# Patient Record
Sex: Female | Born: 1970 | Race: Black or African American | Hispanic: No | Marital: Married | Smoking: Never smoker
Health system: Southern US, Community
[De-identification: ages and names within clinical notes are randomized; demographics above are authoritative.]

## PROBLEM LIST (undated history)

## (undated) DIAGNOSIS — I10 Essential (primary) hypertension: Secondary | ICD-10-CM

## (undated) HISTORY — DX: Essential (primary) hypertension: I10

---

## 2006-11-10 ENCOUNTER — Other Ambulatory Visit: Admission: RE | Admit: 2006-11-10 | Discharge: 2006-11-10 | Payer: Self-pay | Admitting: Family Medicine

## 2008-05-07 ENCOUNTER — Other Ambulatory Visit: Admission: RE | Admit: 2008-05-07 | Discharge: 2008-05-07 | Payer: Self-pay | Admitting: Family Medicine

## 2008-08-28 ENCOUNTER — Encounter (INDEPENDENT_AMBULATORY_CARE_PROVIDER_SITE_OTHER): Payer: Self-pay | Admitting: Obstetrics and Gynecology

## 2008-08-28 ENCOUNTER — Ambulatory Visit (HOSPITAL_COMMUNITY): Admission: RE | Admit: 2008-08-28 | Discharge: 2008-08-29 | Payer: Self-pay | Admitting: Obstetrics and Gynecology

## 2010-06-14 LAB — CBC
HCT: 35.1 % — ABNORMAL LOW (ref 36.0–46.0)
MCHC: 33.6 g/dL (ref 30.0–36.0)
MCV: 87.4 fL (ref 78.0–100.0)
Platelets: 256 10*3/uL (ref 150–400)
Platelets: 261 10*3/uL (ref 150–400)
RDW: 12.9 % (ref 11.5–15.5)
RDW: 13 % (ref 11.5–15.5)

## 2010-06-14 LAB — BASIC METABOLIC PANEL
CO2: 27 mEq/L (ref 19–32)
Calcium: 8.9 mg/dL (ref 8.4–10.5)
Creatinine, Ser: 0.86 mg/dL (ref 0.4–1.2)
GFR calc Af Amer: >60 mL/min (ref >60)
Glucose, Bld: 86 mg/dL (ref 70–99)

## 2010-06-14 LAB — HCG, SERUM, QUALITATIVE: Preg, Serum: NEGATIVE

## 2010-06-14 LAB — TYPE AND SCREEN: ABO/RH(D): AB POS

## 2010-07-20 NOTE — Op Note (Signed)
Theresa Fuentes, Theresa Fuentes              ACCOUNT NO.:  [id number]   MEDICAL RECORD NO.:  [id number]          PATIENT TYPE:  OIB   LOCATION:  9308                          FACILITY:  [facility]   PHYSICIAN:  Charles A. 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   PROCEDURE:  Transvaginal hysterectomy.   SURGEON:  Charles A. Sydnee Cabal, MD   ASSISTANT:  Gerald Leitz, MD   ANESTHESIA:  General by the endotracheal route.   OPERATIVE FINDINGS:  Normal tubes and ovaries.  Small uterus.   SPECIMEN:  Uterus to Pathology.   BLOOD LOSS:  250 mL.   Instrument, sponge and needle count correct x2.   DESCRIPTION OF PROCEDURE:  The patient was taken to the operating room,  placed supine position.  General anesthesia was induced without  difficulty.  She was then placed in dorsal lithotomy position and  sterile prep and drape was undertaken.  A weighted speculum was placed  in the vagina and the cervix was injected with 1% lidocaine with 1:200  epinephrine.  Circumferentially circumferential scoring incision was  then made.  Bladder pillars were cut anteriorly and sharp dissection was  used to develop the bladder plane.  Ray-Tec was intact in the area and  this did achieve anterior entry into the peritoneal cavity without  injury to the bladder.  Posterior colpotomy was made without difficulty  or injury to the bowel.  The uterosacral ligaments were taken in a  transfixion stitch with 0 Vicryl and held.  Uterine arteries were taken  on either side with 0 Vicryl transfixion stitches.  Hemostasis was  excellent.  Another cardinal ligament was taken up either side in  transfixion stitch with good  hemostasis.  Uterus was then inverted and  cornual regions were grasped, cut, free tied and then transfixion stitch  were placed with good hemostasis resulting.  There was moderate amount  of bleeding from the cuff and decision was made to go ahead and just get  it closed.  Richardson angle sutures were placed on the vaginal angles  with good hemostasis noted.  All  pedicles were of excellent hemostasis.  Richardson angle suture was  placed on the left and held.  Running locking 0 Vicryl was used to close  the cuff in running locking layer.  Hemostasis was excellent.  The  patient tolerated the procedure well and was taken to recovery with  physician in attendance.      Charles A. Sydnee Cabal, MD  Electronically Signed     CAD/MEDQ  D:  08/28/2008  T:  08/29/2008  Job:  [id number]

## 2014-06-30 ENCOUNTER — Other Ambulatory Visit: Payer: Self-pay

## 2014-06-30 DIAGNOSIS — Z1231 Encounter for screening mammogram for malignant neoplasm of breast: Secondary | ICD-10-CM

## 2014-07-17 ENCOUNTER — Other Ambulatory Visit: Payer: Self-pay

## 2014-07-17 ENCOUNTER — Ambulatory Visit
Admission: RE | Admit: 2014-07-17 | Discharge: 2014-07-17 | Disposition: A | Discharge status: Home / Self Care | Payer: Federal, State, Local not specified - PPO | Source: Ambulatory Visit

## 2014-07-17 DIAGNOSIS — Z1231 Encounter for screening mammogram for malignant neoplasm of breast: Secondary | ICD-10-CM

## 2016-06-07 ENCOUNTER — Ambulatory Visit: Payer: Self-pay

## 2016-06-07 ENCOUNTER — Ambulatory Visit (INDEPENDENT_AMBULATORY_CARE_PROVIDER_SITE_OTHER): Payer: Federal, State, Local not specified - PPO

## 2016-06-07 ENCOUNTER — Ambulatory Visit (INDEPENDENT_AMBULATORY_CARE_PROVIDER_SITE_OTHER): Payer: Federal, State, Local not specified - PPO | Admitting: Physician Assistant

## 2016-06-07 VITALS — BP 136/86 | HR 85 | Temp 97.9°F | Resp 16 | Wt 320.0 lb

## 2016-06-07 DIAGNOSIS — R0600 Dyspnea, unspecified: Secondary | ICD-10-CM | POA: Diagnosis not present

## 2016-06-07 DIAGNOSIS — H6983 Other specified disorders of Eustachian tube, bilateral: Secondary | ICD-10-CM

## 2016-06-07 MED ORDER — MECLIZINE HCL 25 MG PO TABS: 25.0000 mg | ORAL_TABLET | Freq: Three times a day (TID) | ORAL | 0 refills | Status: AC | PRN

## 2016-06-07 MED ORDER — FLUTICASONE PROPIONATE 50 MCG/ACT NA SUSP: 2.0000 | Freq: Every day | NASAL | 1 refills | Status: AC

## 2016-06-07 MED ORDER — PREDNISONE 20 MG PO TABS: 40.0000 mg | ORAL_TABLET | Freq: Every day | ORAL | 0 refills | Status: AC

## 2016-06-07 MED ORDER — CETIRIZINE HCL 10 MG PO TABS: 10.0000 mg | ORAL_TABLET | Freq: Every day | ORAL | 11 refills | Status: AC

## 2016-06-07 NOTE — Progress Notes (Signed)
PRIMARY CARE AT [facility] [address], [place] [address] [phone]  Date:  06/07/2016   Name:  Theresa Fuentes   DOB:  1970/08/29   MRN:  [id number]  PCP:  No PCP Per Patient    History of Present Illness:  Theresa Fuentes is a 46 y.o. female patient who presents to PCP with  Chief Complaint  Patient presents with  . Dizziness     X 1 DAY  . Sinus Problem     Patient reports dizziness for 1 day, and sinus problems.  Patient states that she feels frontal sinus pain and has watery eyes during the day.  Her head feels full.  No fever, sob, or dyspnea.  She expresses vertigo-like symptoms of the room spinning when she rises and is moving around.  She has noted no diaphoresis, or sob with these symptoms.  She reports no abnormal leg swelling or cough.  She feels the room spinning more when she is lying down on her back.  Comforted by laying on her side.  No sneezing.  Seasonal allergies.  Currently taking benadryl which helps her symptoms.   There are no active problems to display for this patient.   No past medical history on file.  No past surgical history on file.  Social History  Substance Use Topics  . Smoking status: Not on file  . Smokeless tobacco: Not on file  . Alcohol use Not on file    No family history on file.  No Known Allergies  Medication list has been reviewed and updated.  No current outpatient prescriptions on file prior to visit.   No current facility-administered medications on file prior to visit.     ROS ROS otherwise unremarkable unless listed above.  Physical Examination: BP 136/86   Pulse 85   Temp 97.9 F (36.6 C) (Oral)   Resp 16   Wt (!) 320 lb (145.2 kg)   SpO2 96%  Ideal Body Weight:    Physical Exam  Constitutional: She is oriented to person, place, and time. She appears well-developed and well-nourished. No distress.  HENT:  Head: Normocephalic and atraumatic.  Right Ear: External ear normal. A middle ear effusion is  present.  Left Ear: External ear normal. A middle ear effusion is present.  Eyes: Conjunctivae and EOM are normal. Pupils are equal, round, and reactive to light.  Cardiovascular: Normal rate.   Pulmonary/Chest: Effort normal. No respiratory distress.  Neurological: She is alert and oriented to person, place, and time.  Skin: She is not diaphoretic.  Psychiatric: She has a normal mood and affect. Her behavior is normal.   maneuvering (dix hall-pike) did not render any nystagmus, however she reports some mild room shifting with this exercise.    Dg Chest 2 View  Result Date: 06/07/2016 CLINICAL DATA:  Wheezing and lightheadedness, possible dyspnea, abnormal lung sounds on the right. EXAM: CHEST  2 VIEW COMPARISON:  None in PACs FINDINGS: The lungs are adequately inflated. There is no focal infiltrate. There is no pleural effusion. The heart and pulmonary vascularity are normal. The mediastinum is normal in width. The trachea is midline. The bony thorax exhibits no acute abnormality. IMPRESSION: No pneumonia, CHF, nor other acute cardiopulmonary abnormality. Electronically Signed   By: David  [place] M.D.   On: 06/07/2016 13:25    Orthostatic VS for the past 24 hrs (Last 3 readings):  BP- Lying Pulse- Lying BP- Sitting Pulse- Sitting BP- Standing at 0 minutes Pulse- Standing at 0  minutes  06/07/16 1311 130/85 91 128/85 91 121/83 97    Assessment and Plan: Theresa Fuentes is a 46 y.o. female who is here today for cc of dizziness and sinus issues.   This likely is bppv/eustachian tube dysfunction.  I will give her a short prednisone burst to help to relieve this congestion and restore equilibrium.  No cardiomegaly, I do not believe that her symptoms are cardiac related. Advised her to start a zyrtec as well rtc as needed Dysfunction of both eustachian tubes - Plan: cetirizine (ZYRTEC) 10 MG tablet, predniSONE (DELTASONE) 20 MG tablet  Dyspnea, unspecified type - Plan: DG Chest 2 View, CBC,  meclizine (ANTIVERT) 25 MG tablet, fluticasone (FLONASE) 50 MCG/ACT nasal spray  Theresa Platt, PA-C [facility] 4/4/201810:37 AM

## 2016-06-07 NOTE — Patient Instructions (Addendum)
Eustachian Tube Dysfunction The eustachian tube connects the middle ear to the back of the nose. It regulates air pressure in the middle ear by allowing air to move between the ear and nose. It also helps to drain fluid from the middle ear space. When the eustachian tube does not function properly, air pressure, fluid, or both can build up in the middle ear. Eustachian tube dysfunction can affect one or both ears. What are the causes? This condition happens when the eustachian tube becomes blocked or cannot open normally. This may result from:  Ear infections.  Colds and other upper respiratory infections.  Allergies.  Irritation, such as from cigarette smoke or acid from the stomach coming up into the esophagus (gastroesophageal reflux).  Sudden changes in air pressure, such as from descending in an airplane.  Abnormal growths in the nose or throat, such as nasal polyps, tumors, or enlarged tissue at the back of the throat (adenoids). What increases the risk? This condition may be more likely to develop in people who smoke and people who are overweight. Eustachian tube dysfunction may also be more likely to develop in children, especially children who have:  Certain birth defects of the mouth, such as cleft palate.  Large tonsils and adenoids. What are the signs or symptoms? Symptoms of this condition may include:  A feeling of fullness in the ear.  Ear pain.  Clicking or popping noises in the ear.  Ringing in the ear.  Hearing loss.  Loss of balance. Symptoms may get worse when the air pressure around you changes, such as when you travel to an area of high elevation or fly on an airplane. How is this diagnosed? This condition may be diagnosed based on:  Your symptoms.  A physical exam of your ear, nose, and throat.  Tests, such as those that measure:  The movement of your eardrum (tympanogram).  Your hearing (audiometry). How is this treated? Treatment depends on  the cause and severity of your condition. If your symptoms are mild, you may be able to relieve your symptoms by moving air into ("popping") your ears. If you have symptoms of fluid in your ears, treatment may include:  Decongestants.  Antihistamines.  Nasal sprays or ear drops that contain medicines that reduce swelling (steroids). In some cases, you may need to have a procedure to drain the fluid in your eardrum (myringotomy). In this procedure, a small tube is placed in the eardrum to:  Drain the fluid.  Restore the air in the middle ear space. Follow these instructions at home:  Take over-the-counter and prescription medicines only as told by your health care provider.  Use techniques to help pop your ears as recommended by your health care provider. These may include:  Chewing gum.  Yawning.  Frequent, forceful swallowing.  Closing your mouth, holding your nose closed, and gently blowing as if you are trying to blow air out of your nose.  Do not do any of the following until your health care provider approves:  Travel to high altitudes.  Fly in airplanes.  Work in a pressurized cabin or room.  Scuba dive.  Keep your ears dry. Dry your ears completely after showering or bathing.  Do not smoke.  Keep all follow-up visits as told by your health care provider. This is important. Contact a health care provider if:  Your symptoms do not go away after treatment.  Your symptoms come back after treatment.  You are unable to pop your ears.    You have:  A fever.  Pain in your ear.  Pain in your head or neck.  Fluid draining from your ear.  Your hearing suddenly changes.  You become very dizzy.  You lose your balance. This information is not intended to replace advice given to you by your health care provider. Make sure you discuss any questions you have with your health care provider. Document Released: 03/20/2015 Document Revised: 07/30/2015 Document  Reviewed: 03/12/2014 Elsevier Interactive Patient Education  2017 Elsevier Inc.     IF you received an x-ray today, you will receive an invoice from [facility]. Please contact [facility] at [phone] with questions or concerns regarding your invoice.   IF you received labwork today, you will receive an invoice from [facility]. Please contact [facility] at [phone] with questions or concerns regarding your invoice.   Our billing staff will not be able to assist you with questions regarding bills from these companies.  You will be contacted with the lab results as soon as they are available. The fastest way to get your results is to activate your My Chart account. Instructions are located on the last page of this paperwork. If you have not heard from us regarding the results in 2 weeks, please contact this office.      

## 2016-06-08 LAB — CBC
Hematocrit: 43.5 % (ref 34.0–46.6)
Hemoglobin: 14.5 g/dL (ref 11.1–15.9)
MCH: 28.3 pg (ref 26.6–33.0)
MCHC: 33.3 g/dL (ref 31.5–35.7)
MCV: 85 fL (ref 79–97)
PLATELETS: 335 10*3/uL (ref 150–379)
RBC: 5.12 x10E6/uL (ref 3.77–5.28)
RDW: 13.5 % (ref 12.3–15.4)
WBC: 8.6 10*3/uL (ref 3.4–10.8)

## 2016-09-19 ENCOUNTER — Other Ambulatory Visit: Payer: Self-pay | Admitting: Family Medicine

## 2016-09-19 DIAGNOSIS — Z1231 Encounter for screening mammogram for malignant neoplasm of breast: Secondary | ICD-10-CM

## 2016-10-07 ENCOUNTER — Ambulatory Visit
Admission: RE | Admit: 2016-10-07 | Discharge: 2016-10-07 | Disposition: A | Discharge status: Home / Self Care | Payer: Federal, State, Local not specified - PPO | Source: Ambulatory Visit | Attending: Family Medicine | Admitting: Family Medicine

## 2016-10-07 ENCOUNTER — Other Ambulatory Visit: Payer: Self-pay | Admitting: Family Medicine

## 2016-10-07 DIAGNOSIS — Z1231 Encounter for screening mammogram for malignant neoplasm of breast: Secondary | ICD-10-CM

## 2017-06-06 ENCOUNTER — Encounter: Payer: Self-pay | Admitting: Physician Assistant

## 2018-02-06 ENCOUNTER — Other Ambulatory Visit: Payer: Self-pay | Admitting: Family Medicine

## 2018-02-06 DIAGNOSIS — Z1231 Encounter for screening mammogram for malignant neoplasm of breast: Secondary | ICD-10-CM

## 2018-02-08 ENCOUNTER — Ambulatory Visit
Admission: RE | Admit: 2018-02-08 | Discharge: 2018-02-08 | Disposition: A | Discharge status: Home / Self Care | Payer: Federal, State, Local not specified - PPO | Source: Ambulatory Visit | Attending: Family Medicine | Admitting: Family Medicine

## 2018-02-08 DIAGNOSIS — Z1231 Encounter for screening mammogram for malignant neoplasm of breast: Secondary | ICD-10-CM

## 2018-02-27 DIAGNOSIS — Z131 Encounter for screening for diabetes mellitus: Secondary | ICD-10-CM | POA: Diagnosis not present

## 2018-02-27 DIAGNOSIS — Z0000 Encounter for general adult medical examination without abnormal findings: Secondary | ICD-10-CM | POA: Diagnosis not present

## 2018-02-27 DIAGNOSIS — I10 Essential (primary) hypertension: Secondary | ICD-10-CM | POA: Diagnosis not present

## 2018-02-27 DIAGNOSIS — Z6841 Body Mass Index (BMI) 40.0 and over, adult: Secondary | ICD-10-CM | POA: Diagnosis not present

## 2018-02-27 DIAGNOSIS — Z1331 Encounter for screening for depression: Secondary | ICD-10-CM | POA: Diagnosis not present

## 2018-03-08 DIAGNOSIS — I10 Essential (primary) hypertension: Secondary | ICD-10-CM | POA: Diagnosis not present

## 2019-01-02 ENCOUNTER — Other Ambulatory Visit: Payer: Self-pay | Admitting: Gynecology

## 2019-01-02 DIAGNOSIS — Z1231 Encounter for screening mammogram for malignant neoplasm of breast: Secondary | ICD-10-CM

## 2019-02-20 ENCOUNTER — Ambulatory Visit
Admission: RE | Admit: 2019-02-20 | Discharge: 2019-02-20 | Disposition: A | Discharge status: Home / Self Care | Payer: Federal, State, Local not specified - PPO | Source: Ambulatory Visit | Attending: Gynecology | Admitting: Gynecology

## 2019-02-20 ENCOUNTER — Other Ambulatory Visit: Payer: Self-pay

## 2019-02-20 DIAGNOSIS — Z1231 Encounter for screening mammogram for malignant neoplasm of breast: Secondary | ICD-10-CM

## 2019-05-30 ENCOUNTER — Ambulatory Visit: Payer: Federal, State, Local not specified - PPO | Attending: Internal Medicine

## 2019-05-30 DIAGNOSIS — Z23 Encounter for immunization: Secondary | ICD-10-CM

## 2019-05-30 NOTE — Progress Notes (Signed)
   Covid-19 Vaccination Clinic  Name:  Theresa Fuentes    MRN: [id number] DOB: February 26, 1971  05/30/2019  [name] was observed post Covid-19 immunization for 15 minutes without incident. She was provided with Vaccine Information Sheet and instruction to access the V-Safe system.   [name] was instructed to call 911 with any severe reactions post vaccine: Marland Kitchen Difficulty breathing  . Swelling of face and throat  . A fast heartbeat  . A bad rash all over body  . Dizziness and weakness   Immunizations Administered    Name Date Dose VIS Date Route   Pfizer COVID-19 Vaccine 05/30/2019 11:59 AM 0.3 mL 02/15/2019 Intramuscular   Manufacturer: [employer], [employer]   Lot: [id number]   NDC: [address]-6

## 2019-06-24 ENCOUNTER — Ambulatory Visit: Payer: Federal, State, Local not specified - PPO

## 2019-06-26 ENCOUNTER — Ambulatory Visit: Payer: Federal, State, Local not specified - PPO

## 2019-06-27 ENCOUNTER — Ambulatory Visit: Payer: Federal, State, Local not specified - PPO | Attending: Internal Medicine

## 2019-06-27 DIAGNOSIS — Z23 Encounter for immunization: Secondary | ICD-10-CM

## 2019-06-27 NOTE — Progress Notes (Signed)
   Covid-19 Vaccination Clinic  Name:  Theresa Fuentes    MRN: [id number] DOB: 1971-02-16  06/27/2019  Theresa Fuentes was observed post Covid-19 immunization for 15 minutes without incident. She was provided with Vaccine Information Sheet and instruction to access the V-Safe system.   Theresa Fuentes was instructed to call 911 with any severe reactions post vaccine: Marland Kitchen Difficulty breathing  . Swelling of face and throat  . A fast heartbeat  . A bad rash all over body  . Dizziness and weakness   Immunizations Administered    Name Date Dose VIS Date Route   Pfizer COVID-19 Vaccine 06/27/2019 12:46 PM 0.3 mL 05/01/2018 Intramuscular   Manufacturer: [employer], [employer]   Lot: [id number]   NDC: [address]-8

## 2019-08-19 DIAGNOSIS — M25562 Pain in left knee: Secondary | ICD-10-CM | POA: Diagnosis not present

## 2019-08-19 DIAGNOSIS — R7303 Prediabetes: Secondary | ICD-10-CM | POA: Diagnosis not present

## 2019-08-19 DIAGNOSIS — R82998 Other abnormal findings in urine: Secondary | ICD-10-CM | POA: Diagnosis not present

## 2019-08-19 DIAGNOSIS — R030 Elevated blood-pressure reading, without diagnosis of hypertension: Secondary | ICD-10-CM | POA: Diagnosis not present

## 2019-08-23 ENCOUNTER — Other Ambulatory Visit: Payer: Self-pay | Admitting: Nurse Practitioner

## 2019-08-23 ENCOUNTER — Other Ambulatory Visit: Payer: Self-pay

## 2019-08-23 ENCOUNTER — Ambulatory Visit
Admission: RE | Admit: 2019-08-23 | Discharge: 2019-08-23 | Disposition: A | Discharge status: Home / Self Care | Payer: Federal, State, Local not specified - PPO | Source: Ambulatory Visit | Attending: Nurse Practitioner | Admitting: Nurse Practitioner

## 2019-08-23 DIAGNOSIS — M25562 Pain in left knee: Secondary | ICD-10-CM

## 2019-08-23 DIAGNOSIS — M1712 Unilateral primary osteoarthritis, left knee: Secondary | ICD-10-CM | POA: Diagnosis not present

## 2019-08-23 DIAGNOSIS — M7989 Other specified soft tissue disorders: Secondary | ICD-10-CM | POA: Diagnosis not present

## 2019-08-28 ENCOUNTER — Other Ambulatory Visit: Payer: Self-pay | Admitting: Nurse Practitioner

## 2019-08-28 ENCOUNTER — Other Ambulatory Visit (HOSPITAL_COMMUNITY)
Admission: RE | Admit: 2019-08-28 | Discharge: 2019-08-28 | Disposition: A | Discharge status: Home / Self Care | Payer: Federal, State, Local not specified - PPO | Source: Ambulatory Visit | Attending: Nurse Practitioner | Admitting: Nurse Practitioner

## 2019-08-28 DIAGNOSIS — Z1151 Encounter for screening for human papillomavirus (HPV): Secondary | ICD-10-CM | POA: Diagnosis not present

## 2019-08-28 DIAGNOSIS — Z124 Encounter for screening for malignant neoplasm of cervix: Secondary | ICD-10-CM | POA: Insufficient documentation

## 2019-08-31 DIAGNOSIS — Z9884 Bariatric surgery status: Secondary | ICD-10-CM | POA: Diagnosis not present

## 2019-08-31 DIAGNOSIS — I10 Essential (primary) hypertension: Secondary | ICD-10-CM | POA: Diagnosis not present

## 2019-08-31 DIAGNOSIS — R7303 Prediabetes: Secondary | ICD-10-CM | POA: Diagnosis not present

## 2019-09-02 LAB — CYTOLOGY - PAP
Chlamydia: NEGATIVE
Comment: NEGATIVE
Comment: NEGATIVE
Comment: NEGATIVE
Comment: NEGATIVE
Comment: NORMAL
Diagnosis: NEGATIVE
HSV1: NEGATIVE
HSV2: NEGATIVE
High risk HPV: NEGATIVE
Neisseria Gonorrhea: NEGATIVE
Trichomonas: NEGATIVE

## 2019-10-02 ENCOUNTER — Other Ambulatory Visit: Payer: Self-pay | Admitting: Nurse Practitioner

## 2019-10-02 DIAGNOSIS — Z1231 Encounter for screening mammogram for malignant neoplasm of breast: Secondary | ICD-10-CM

## 2020-01-20 DIAGNOSIS — Z23 Encounter for immunization: Secondary | ICD-10-CM | POA: Diagnosis not present

## 2020-01-20 DIAGNOSIS — I10 Essential (primary) hypertension: Secondary | ICD-10-CM | POA: Diagnosis not present

## 2020-01-20 DIAGNOSIS — Z6841 Body Mass Index (BMI) 40.0 and over, adult: Secondary | ICD-10-CM | POA: Diagnosis not present

## 2020-01-20 DIAGNOSIS — R7303 Prediabetes: Secondary | ICD-10-CM | POA: Diagnosis not present

## 2020-01-21 DIAGNOSIS — I10 Essential (primary) hypertension: Secondary | ICD-10-CM | POA: Diagnosis not present

## 2020-01-21 DIAGNOSIS — R7303 Prediabetes: Secondary | ICD-10-CM | POA: Diagnosis not present

## 2020-02-24 ENCOUNTER — Ambulatory Visit
Admission: RE | Admit: 2020-02-24 | Discharge: 2020-02-24 | Disposition: A | Discharge status: Home / Self Care | Payer: Federal, State, Local not specified - PPO | Source: Ambulatory Visit | Attending: Nurse Practitioner | Admitting: Nurse Practitioner

## 2020-02-24 ENCOUNTER — Other Ambulatory Visit: Payer: Self-pay

## 2020-02-24 DIAGNOSIS — Z1231 Encounter for screening mammogram for malignant neoplasm of breast: Secondary | ICD-10-CM

## 2020-03-16 DIAGNOSIS — Z1152 Encounter for screening for COVID-19: Secondary | ICD-10-CM | POA: Diagnosis not present

## 2020-04-02 ENCOUNTER — Other Ambulatory Visit: Payer: Self-pay

## 2020-04-02 ENCOUNTER — Ambulatory Visit
Admission: RE | Admit: 2020-04-02 | Discharge: 2020-04-02 | Disposition: A | Discharge status: Home / Self Care | Payer: Federal, State, Local not specified - PPO | Source: Ambulatory Visit | Attending: Nurse Practitioner | Admitting: Nurse Practitioner

## 2020-04-02 DIAGNOSIS — Z1231 Encounter for screening mammogram for malignant neoplasm of breast: Secondary | ICD-10-CM | POA: Diagnosis not present

## 2020-05-19 DIAGNOSIS — I10 Essential (primary) hypertension: Secondary | ICD-10-CM | POA: Diagnosis not present

## 2020-05-19 DIAGNOSIS — Z9884 Bariatric surgery status: Secondary | ICD-10-CM | POA: Diagnosis not present

## 2020-05-19 DIAGNOSIS — R7303 Prediabetes: Secondary | ICD-10-CM | POA: Diagnosis not present

## 2020-05-19 DIAGNOSIS — Z7189 Other specified counseling: Secondary | ICD-10-CM | POA: Diagnosis not present

## 2020-08-17 ENCOUNTER — Emergency Department (HOSPITAL_COMMUNITY)
Admission: EM | Admit: 2020-08-17 | Discharge: 2020-08-18 | Disposition: A | Discharge status: Home / Self Care | Payer: Federal, State, Local not specified - PPO | Attending: Emergency Medicine | Admitting: Emergency Medicine

## 2020-08-17 ENCOUNTER — Encounter (HOSPITAL_COMMUNITY): Payer: Self-pay

## 2020-08-17 ENCOUNTER — Emergency Department (HOSPITAL_COMMUNITY): Payer: Federal, State, Local not specified - PPO

## 2020-08-17 ENCOUNTER — Other Ambulatory Visit: Payer: Self-pay

## 2020-08-17 DIAGNOSIS — S82142A Displaced bicondylar fracture of left tibia, initial encounter for closed fracture: Secondary | ICD-10-CM | POA: Diagnosis not present

## 2020-08-17 DIAGNOSIS — Z23 Encounter for immunization: Secondary | ICD-10-CM | POA: Insufficient documentation

## 2020-08-17 DIAGNOSIS — M1712 Unilateral primary osteoarthritis, left knee: Secondary | ICD-10-CM | POA: Diagnosis not present

## 2020-08-17 DIAGNOSIS — S82122A Displaced fracture of lateral condyle of left tibia, initial encounter for closed fracture: Secondary | ICD-10-CM | POA: Insufficient documentation

## 2020-08-17 DIAGNOSIS — S90811A Abrasion, right foot, initial encounter: Secondary | ICD-10-CM | POA: Insufficient documentation

## 2020-08-17 DIAGNOSIS — Z043 Encounter for examination and observation following other accident: Secondary | ICD-10-CM | POA: Diagnosis not present

## 2020-08-17 DIAGNOSIS — Y92410 Unspecified street and highway as the place of occurrence of the external cause: Secondary | ICD-10-CM | POA: Diagnosis not present

## 2020-08-17 DIAGNOSIS — M25462 Effusion, left knee: Secondary | ICD-10-CM | POA: Diagnosis not present

## 2020-08-17 DIAGNOSIS — S8992XA Unspecified injury of left lower leg, initial encounter: Secondary | ICD-10-CM | POA: Diagnosis not present

## 2020-08-17 HISTORY — DX: Essential (primary) hypertension: I10

## 2020-08-17 MED ORDER — TETANUS-DIPHTH-ACELL PERTUSSIS 5-2.5-18.5 LF-MCG/0.5 IM SUSY
0.5000 mL | PREFILLED_SYRINGE | Freq: Once | INTRAMUSCULAR | Status: AC
  Administered 2020-08-17: 20:00:00 0.5 mL via INTRAMUSCULAR
  Filled 2020-08-17: qty 0.5
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
## 2020-08-18 ENCOUNTER — Emergency Department (HOSPITAL_COMMUNITY): Payer: Federal, State, Local not specified - PPO

## 2020-08-18 DIAGNOSIS — M1712 Unilateral primary osteoarthritis, left knee: Secondary | ICD-10-CM | POA: Diagnosis not present

## 2020-08-18 DIAGNOSIS — S82125A Nondisplaced fracture of lateral condyle of left tibia, initial encounter for closed fracture: Secondary | ICD-10-CM | POA: Diagnosis not present

## 2020-08-18 DIAGNOSIS — S82142A Displaced bicondylar fracture of left tibia, initial encounter for closed fracture: Secondary | ICD-10-CM | POA: Diagnosis not present

## 2020-08-18 DIAGNOSIS — M25462 Effusion, left knee: Secondary | ICD-10-CM | POA: Diagnosis not present

## 2020-08-18 MED ORDER — METHOCARBAMOL 500 MG PO TABS: 500.0000 mg | ORAL_TABLET | Freq: Two times a day (BID) | ORAL | 0 refills | Status: AC

## 2020-08-18 MED ORDER — OXYCODONE HCL 5 MG PO TABS: 5.0000 mg | ORAL_TABLET | Freq: Four times a day (QID) | ORAL | 0 refills | Status: AC | PRN

## 2020-08-18 MED ORDER — OXYCODONE-ACETAMINOPHEN 5-325 MG PO TABS
2.0000 | ORAL_TABLET | Freq: Once | ORAL | Status: AC
  Administered 2020-08-18: 01:00:00 2 via ORAL
  Filled 2020-08-18: qty 2

## 2020-08-18 NOTE — Progress Notes (Signed)
Orthopedic Tech Progress Note Patient Details:  Theresa Fuentes [birth year] [id number]  Ortho Devices Type of Ortho Device: Knee Immobilizer Ortho Device/Splint Location: lue Ortho Device/Splint Interventions: Ordered, Application, Adjustment   Post Interventions Patient Tolerated: Well Instructions Provided: Care of device, Adjustment of device  Trinna Post 08/18/2020, 1:49 AM

## 2020-08-18 NOTE — ED Notes (Signed)
Ortho tech consulted to apply knee immobilizer and provide crutches.
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
## 2020-08-18 NOTE — Discharge Instructions (Addendum)
1. Medications: alternate naprosyn and tylenol for pain control, use Robaxin for muscle spasms and Roxicodone for severe pain usual home medications 2. Treatment: rest, ice, elevate and use brace, drink plenty of fluids, gentle stretching -nonweightbearing 3. Follow Up: Please followup with orthopedics as directed. Please return to the ER for worsening symptoms or other concerns

## 2020-08-25 DIAGNOSIS — S82102S Unspecified fracture of upper end of left tibia, sequela: Secondary | ICD-10-CM | POA: Diagnosis not present

## 2020-08-27 DIAGNOSIS — S82102S Unspecified fracture of upper end of left tibia, sequela: Secondary | ICD-10-CM | POA: Diagnosis not present

## 2020-09-09 DIAGNOSIS — S82102S Unspecified fracture of upper end of left tibia, sequela: Secondary | ICD-10-CM | POA: Diagnosis not present

## 2020-09-11 DIAGNOSIS — S82102S Unspecified fracture of upper end of left tibia, sequela: Secondary | ICD-10-CM | POA: Diagnosis not present

## 2020-09-16 DIAGNOSIS — S82102S Unspecified fracture of upper end of left tibia, sequela: Secondary | ICD-10-CM | POA: Diagnosis not present

## 2020-09-22 DIAGNOSIS — S82102S Unspecified fracture of upper end of left tibia, sequela: Secondary | ICD-10-CM | POA: Diagnosis not present

## 2020-10-01 DIAGNOSIS — S82102A Unspecified fracture of upper end of left tibia, initial encounter for closed fracture: Secondary | ICD-10-CM | POA: Diagnosis not present

## 2020-11-03 DIAGNOSIS — R7303 Prediabetes: Secondary | ICD-10-CM | POA: Diagnosis not present

## 2020-11-03 DIAGNOSIS — I10 Essential (primary) hypertension: Secondary | ICD-10-CM | POA: Diagnosis not present

## 2020-11-03 DIAGNOSIS — L83 Acanthosis nigricans: Secondary | ICD-10-CM | POA: Diagnosis not present

## 2020-12-09 DIAGNOSIS — I10 Essential (primary) hypertension: Secondary | ICD-10-CM | POA: Diagnosis not present

## 2020-12-09 DIAGNOSIS — R7303 Prediabetes: Secondary | ICD-10-CM | POA: Diagnosis not present

## 2020-12-09 DIAGNOSIS — U071 COVID-19: Secondary | ICD-10-CM | POA: Diagnosis not present

## 2021-05-03 ENCOUNTER — Other Ambulatory Visit: Payer: Self-pay | Admitting: Family Medicine

## 2021-05-03 DIAGNOSIS — Z1231 Encounter for screening mammogram for malignant neoplasm of breast: Secondary | ICD-10-CM

## 2021-05-11 ENCOUNTER — Inpatient Hospital Stay: Admission: RE | Admit: 2021-05-11 | Payer: Federal, State, Local not specified - PPO | Source: Ambulatory Visit

## 2021-05-11 ENCOUNTER — Ambulatory Visit
Admission: RE | Admit: 2021-05-11 | Discharge: 2021-05-11 | Disposition: A | Discharge status: Home / Self Care | Payer: Federal, State, Local not specified - PPO | Source: Ambulatory Visit | Attending: Family Medicine | Admitting: Family Medicine

## 2021-05-11 DIAGNOSIS — Z1231 Encounter for screening mammogram for malignant neoplasm of breast: Secondary | ICD-10-CM | POA: Diagnosis not present

## 2021-05-12 DIAGNOSIS — R7303 Prediabetes: Secondary | ICD-10-CM | POA: Diagnosis not present

## 2021-05-12 DIAGNOSIS — I10 Essential (primary) hypertension: Secondary | ICD-10-CM | POA: Diagnosis not present

## 2021-06-13 ENCOUNTER — Encounter (INDEPENDENT_AMBULATORY_CARE_PROVIDER_SITE_OTHER): Payer: Federal, State, Local not specified - PPO | Admitting: General Surgery

## 2021-06-13 DIAGNOSIS — Z09 Encounter for follow-up examination after completed treatment for conditions other than malignant neoplasm: Secondary | ICD-10-CM
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
## 2021-07-07 DIAGNOSIS — R7303 Prediabetes: Secondary | ICD-10-CM | POA: Diagnosis not present

## 2021-07-07 DIAGNOSIS — I10 Essential (primary) hypertension: Secondary | ICD-10-CM | POA: Diagnosis not present

## 2021-10-25 LAB — COLOGUARD: COLOGUARD: NEGATIVE

## 2021-10-25 LAB — EXTERNAL GENERIC LAB PROCEDURE: COLOGUARD: NEGATIVE

## 2021-11-30 DIAGNOSIS — E8881 Metabolic syndrome: Secondary | ICD-10-CM | POA: Diagnosis not present

## 2021-11-30 DIAGNOSIS — R7303 Prediabetes: Secondary | ICD-10-CM | POA: Diagnosis not present

## 2021-11-30 DIAGNOSIS — I10 Essential (primary) hypertension: Secondary | ICD-10-CM | POA: Diagnosis not present

## 2022-01-03 ENCOUNTER — Other Ambulatory Visit (HOSPITAL_COMMUNITY): Payer: Self-pay

## 2022-01-03 MED ORDER — OZEMPIC (2 MG/DOSE) 8 MG/3ML ~~LOC~~ SOPN
2.5000 mg | PEN_INJECTOR | SUBCUTANEOUS | 3 refills | Status: DC
  Filled 2022-01-03: qty 12, 84d supply, fill #0

## 2022-01-03 MED ORDER — OZEMPIC (2 MG/DOSE) 8 MG/3ML ~~LOC~~ SOPN
2.0000 mg | PEN_INJECTOR | SUBCUTANEOUS | 3 refills | Status: DC
  Filled 2022-01-03: qty 3, 28d supply, fill #0
  Filled 2022-01-17: qty 3, 30d supply, fill #0
  Filled 2022-03-01: qty 3, 30d supply, fill #1

## 2022-01-11 ENCOUNTER — Other Ambulatory Visit (HOSPITAL_COMMUNITY): Payer: Self-pay

## 2022-01-17 ENCOUNTER — Other Ambulatory Visit (HOSPITAL_COMMUNITY): Payer: Self-pay

## 2022-01-19 ENCOUNTER — Other Ambulatory Visit (HOSPITAL_COMMUNITY): Payer: Self-pay

## 2022-03-03 ENCOUNTER — Other Ambulatory Visit (HOSPITAL_COMMUNITY): Payer: Self-pay

## 2022-03-03 ENCOUNTER — Other Ambulatory Visit: Payer: Self-pay

## 2022-03-03 DIAGNOSIS — Z9884 Bariatric surgery status: Secondary | ICD-10-CM | POA: Diagnosis not present

## 2022-03-03 DIAGNOSIS — R7303 Prediabetes: Secondary | ICD-10-CM | POA: Diagnosis not present

## 2022-03-03 DIAGNOSIS — Z1389 Encounter for screening for other disorder: Secondary | ICD-10-CM | POA: Diagnosis not present

## 2022-03-03 DIAGNOSIS — E559 Vitamin D deficiency, unspecified: Secondary | ICD-10-CM | POA: Diagnosis not present

## 2022-03-03 DIAGNOSIS — I10 Essential (primary) hypertension: Secondary | ICD-10-CM | POA: Diagnosis not present

## 2022-03-03 DIAGNOSIS — K912 Postsurgical malabsorption, not elsewhere classified: Secondary | ICD-10-CM | POA: Diagnosis not present

## 2022-03-03 DIAGNOSIS — Z9189 Other specified personal risk factors, not elsewhere classified: Secondary | ICD-10-CM | POA: Diagnosis not present

## 2022-03-03 DIAGNOSIS — E8889 Other specified metabolic disorders: Secondary | ICD-10-CM | POA: Diagnosis not present

## 2022-03-03 MED ORDER — ZEPBOUND 7.5 MG/0.5ML ~~LOC~~ SOAJ
7.5000 mg | SUBCUTANEOUS | 1 refills | Status: DC
  Filled 2022-03-03: qty 2, 28d supply, fill #0

## 2022-03-03 MED ORDER — WEGOVY 2.4 MG/0.75ML ~~LOC~~ SOAJ
2.4000 mg | SUBCUTANEOUS | 1 refills | Status: AC
  Filled 2022-03-03 – 2022-03-04 (×3): qty 3, 28d supply, fill #0
  Filled 2022-03-27: qty 3, 28d supply, fill #1

## 2022-03-04 ENCOUNTER — Other Ambulatory Visit: Payer: Self-pay

## 2022-03-04 ENCOUNTER — Other Ambulatory Visit (HOSPITAL_COMMUNITY): Payer: Self-pay

## 2022-03-09 ENCOUNTER — Other Ambulatory Visit: Payer: Self-pay

## 2022-03-09 ENCOUNTER — Other Ambulatory Visit (HOSPITAL_COMMUNITY): Payer: Self-pay

## 2022-03-09 MED ORDER — VITAMIN D (ERGOCALCIFEROL) 1.25 MG (50000 UNIT) PO CAPS
50000.0000 [IU] | ORAL_CAPSULE | ORAL | 2 refills | Status: DC
  Filled 2022-03-09: qty 4, 28d supply, fill #0
  Filled 2022-03-27: qty 4, 28d supply, fill #1
  Filled 2022-04-01: qty 4, 28d supply, fill #0
  Filled 2022-04-22 (×2): qty 4, 28d supply, fill #1

## 2022-03-11 ENCOUNTER — Other Ambulatory Visit (HOSPITAL_COMMUNITY): Payer: Self-pay

## 2022-03-11 ENCOUNTER — Other Ambulatory Visit: Payer: Self-pay

## 2022-03-17 DIAGNOSIS — E539 Vitamin B deficiency, unspecified: Secondary | ICD-10-CM | POA: Diagnosis not present

## 2022-03-17 DIAGNOSIS — G4733 Obstructive sleep apnea (adult) (pediatric): Secondary | ICD-10-CM | POA: Diagnosis not present

## 2022-03-17 DIAGNOSIS — R7303 Prediabetes: Secondary | ICD-10-CM | POA: Diagnosis not present

## 2022-03-17 DIAGNOSIS — I10 Essential (primary) hypertension: Secondary | ICD-10-CM | POA: Diagnosis not present

## 2022-03-28 ENCOUNTER — Other Ambulatory Visit (HOSPITAL_COMMUNITY): Payer: Self-pay

## 2022-03-31 ENCOUNTER — Other Ambulatory Visit (HOSPITAL_COMMUNITY): Payer: Self-pay

## 2022-03-31 DIAGNOSIS — E539 Vitamin B deficiency, unspecified: Secondary | ICD-10-CM | POA: Diagnosis not present

## 2022-03-31 DIAGNOSIS — R7303 Prediabetes: Secondary | ICD-10-CM | POA: Diagnosis not present

## 2022-03-31 DIAGNOSIS — G4733 Obstructive sleep apnea (adult) (pediatric): Secondary | ICD-10-CM | POA: Diagnosis not present

## 2022-03-31 DIAGNOSIS — I10 Essential (primary) hypertension: Secondary | ICD-10-CM | POA: Diagnosis not present

## 2022-03-31 MED ORDER — WEGOVY 2.4 MG/0.75ML ~~LOC~~ SOAJ
2.4000 mg | SUBCUTANEOUS | 1 refills | Status: DC
  Filled 2022-03-31 – 2022-04-22 (×2): qty 3, 28d supply, fill #0
  Filled 2022-05-26: qty 3, 28d supply, fill #1

## 2022-04-01 ENCOUNTER — Other Ambulatory Visit (HOSPITAL_COMMUNITY): Payer: Self-pay

## 2022-04-01 ENCOUNTER — Other Ambulatory Visit: Payer: Self-pay

## 2022-04-22 ENCOUNTER — Other Ambulatory Visit (HOSPITAL_COMMUNITY): Payer: Self-pay

## 2022-04-26 ENCOUNTER — Other Ambulatory Visit (HOSPITAL_COMMUNITY): Payer: Self-pay

## 2022-04-26 DIAGNOSIS — R7303 Prediabetes: Secondary | ICD-10-CM | POA: Diagnosis not present

## 2022-04-26 DIAGNOSIS — I10 Essential (primary) hypertension: Secondary | ICD-10-CM | POA: Diagnosis not present

## 2022-04-26 DIAGNOSIS — E539 Vitamin B deficiency, unspecified: Secondary | ICD-10-CM | POA: Diagnosis not present

## 2022-04-26 DIAGNOSIS — K912 Postsurgical malabsorption, not elsewhere classified: Secondary | ICD-10-CM | POA: Diagnosis not present

## 2022-04-26 MED ORDER — WEGOVY 2.4 MG/0.75ML ~~LOC~~ SOAJ
2.4000 mg | SUBCUTANEOUS | 1 refills | Status: DC
  Filled 2022-04-26 – 2022-06-23 (×2): qty 3, 28d supply, fill #0
  Filled 2022-07-18 – 2022-07-26 (×2): qty 3, 28d supply, fill #1

## 2022-04-26 MED ORDER — VITAMIN D (ERGOCALCIFEROL) 1.25 MG (50000 UNIT) PO CAPS
50000.0000 [IU] | ORAL_CAPSULE | ORAL | 2 refills | Status: DC
  Filled 2022-04-26 – 2022-05-26 (×2): qty 4, 28d supply, fill #0
  Filled 2022-06-19: qty 4, 28d supply, fill #1
  Filled 2022-07-18 – 2022-11-07 (×3): qty 4, 28d supply, fill #2

## 2022-05-06 DIAGNOSIS — Z13220 Encounter for screening for lipoid disorders: Secondary | ICD-10-CM | POA: Diagnosis not present

## 2022-05-06 DIAGNOSIS — I10 Essential (primary) hypertension: Secondary | ICD-10-CM | POA: Diagnosis not present

## 2022-05-26 ENCOUNTER — Other Ambulatory Visit (HOSPITAL_COMMUNITY): Payer: Self-pay

## 2022-06-16 ENCOUNTER — Other Ambulatory Visit (HOSPITAL_COMMUNITY): Payer: Self-pay

## 2022-06-23 ENCOUNTER — Other Ambulatory Visit (HOSPITAL_COMMUNITY): Payer: Self-pay

## 2022-06-23 ENCOUNTER — Other Ambulatory Visit: Payer: Self-pay

## 2022-06-24 ENCOUNTER — Other Ambulatory Visit: Payer: Self-pay

## 2022-07-04 ENCOUNTER — Other Ambulatory Visit (HOSPITAL_COMMUNITY): Payer: Self-pay

## 2022-07-04 DIAGNOSIS — R7303 Prediabetes: Secondary | ICD-10-CM | POA: Diagnosis not present

## 2022-07-04 DIAGNOSIS — G4733 Obstructive sleep apnea (adult) (pediatric): Secondary | ICD-10-CM | POA: Diagnosis not present

## 2022-07-04 DIAGNOSIS — E539 Vitamin B deficiency, unspecified: Secondary | ICD-10-CM | POA: Diagnosis not present

## 2022-07-04 DIAGNOSIS — I10 Essential (primary) hypertension: Secondary | ICD-10-CM | POA: Diagnosis not present

## 2022-07-04 MED ORDER — VITAMIN D (ERGOCALCIFEROL) 1.25 MG (50000 UNIT) PO CAPS
50000.0000 [IU] | ORAL_CAPSULE | ORAL | 2 refills | Status: AC
  Filled 2022-07-04 – 2022-07-26 (×3): qty 4, 28d supply, fill #0
  Filled 2022-09-23: qty 4, 28d supply, fill #1
  Filled 2022-12-07: qty 4, 28d supply, fill #2

## 2022-07-04 MED ORDER — ZEPBOUND 10 MG/0.5ML ~~LOC~~ SOAJ
10.0000 mg | SUBCUTANEOUS | 1 refills | Status: AC
  Filled 2022-07-04 – 2022-08-19 (×2): qty 2, 28d supply, fill #0

## 2022-07-05 ENCOUNTER — Other Ambulatory Visit (HOSPITAL_COMMUNITY): Payer: Self-pay

## 2022-07-07 ENCOUNTER — Other Ambulatory Visit (HOSPITAL_COMMUNITY): Payer: Self-pay

## 2022-07-12 ENCOUNTER — Other Ambulatory Visit (HOSPITAL_COMMUNITY): Payer: Self-pay

## 2022-07-18 ENCOUNTER — Other Ambulatory Visit: Payer: Self-pay

## 2022-07-18 ENCOUNTER — Other Ambulatory Visit (HOSPITAL_COMMUNITY): Payer: Self-pay

## 2022-07-22 ENCOUNTER — Other Ambulatory Visit (HOSPITAL_COMMUNITY): Payer: Self-pay

## 2022-07-25 ENCOUNTER — Other Ambulatory Visit (HOSPITAL_COMMUNITY): Payer: Self-pay

## 2022-07-25 MED ORDER — LOMAIRA 8 MG PO TABS
8.0000 mg | ORAL_TABLET | Freq: Every morning | ORAL | 0 refills | Status: DC
  Filled 2022-07-25: qty 30, 30d supply, fill #0

## 2022-07-26 ENCOUNTER — Other Ambulatory Visit (HOSPITAL_COMMUNITY): Payer: Self-pay

## 2022-08-03 DIAGNOSIS — Z1211 Encounter for screening for malignant neoplasm of colon: Secondary | ICD-10-CM | POA: Diagnosis not present

## 2022-08-04 ENCOUNTER — Other Ambulatory Visit (HOSPITAL_COMMUNITY): Payer: Self-pay

## 2022-08-04 DIAGNOSIS — R7303 Prediabetes: Secondary | ICD-10-CM | POA: Diagnosis not present

## 2022-08-04 DIAGNOSIS — Z6841 Body Mass Index (BMI) 40.0 and over, adult: Secondary | ICD-10-CM | POA: Diagnosis not present

## 2022-08-04 DIAGNOSIS — E539 Vitamin B deficiency, unspecified: Secondary | ICD-10-CM | POA: Diagnosis not present

## 2022-08-04 DIAGNOSIS — G4733 Obstructive sleep apnea (adult) (pediatric): Secondary | ICD-10-CM | POA: Diagnosis not present

## 2022-08-04 DIAGNOSIS — I10 Essential (primary) hypertension: Secondary | ICD-10-CM | POA: Diagnosis not present

## 2022-08-04 MED ORDER — WEGOVY 2.4 MG/0.75ML ~~LOC~~ SOAJ
2.4000 mg | SUBCUTANEOUS | 1 refills | Status: AC
  Filled 2022-08-04 – 2022-11-11 (×3): qty 3, 28d supply, fill #0
  Filled 2022-12-07: qty 3, 28d supply, fill #1

## 2022-08-04 MED ORDER — LOMAIRA 8 MG PO TABS
8.0000 mg | ORAL_TABLET | Freq: Every morning | ORAL | 0 refills | Status: DC
  Filled 2022-08-04 – 2022-08-19 (×2): qty 30, 30d supply, fill #0

## 2022-08-18 ENCOUNTER — Other Ambulatory Visit (HOSPITAL_COMMUNITY): Payer: Self-pay

## 2022-08-18 ENCOUNTER — Other Ambulatory Visit: Payer: Self-pay

## 2022-08-19 ENCOUNTER — Other Ambulatory Visit: Payer: Self-pay

## 2022-08-19 ENCOUNTER — Other Ambulatory Visit (HOSPITAL_COMMUNITY): Payer: Self-pay

## 2022-08-22 ENCOUNTER — Other Ambulatory Visit (HOSPITAL_COMMUNITY): Payer: Self-pay

## 2022-08-22 MED ORDER — WEGOVY 2.4 MG/0.75ML ~~LOC~~ SOAJ
2.4000 mg | SUBCUTANEOUS | 1 refills | Status: AC
  Filled 2022-08-22: qty 3, 28d supply, fill #0
  Filled 2022-09-26: qty 3, 28d supply, fill #1

## 2022-08-24 ENCOUNTER — Other Ambulatory Visit (HOSPITAL_COMMUNITY): Payer: Self-pay

## 2022-08-25 ENCOUNTER — Other Ambulatory Visit: Payer: Self-pay

## 2022-09-06 ENCOUNTER — Other Ambulatory Visit (HOSPITAL_COMMUNITY): Payer: Self-pay

## 2022-09-16 ENCOUNTER — Other Ambulatory Visit (HOSPITAL_COMMUNITY): Payer: Self-pay

## 2022-09-20 ENCOUNTER — Other Ambulatory Visit (HOSPITAL_COMMUNITY): Payer: Self-pay

## 2022-09-20 MED ORDER — LOMAIRA 8 MG PO TABS
8.0000 mg | ORAL_TABLET | ORAL | 0 refills | Status: DC
  Filled 2022-09-20: qty 30, 30d supply, fill #0

## 2022-09-23 ENCOUNTER — Other Ambulatory Visit: Payer: Self-pay

## 2022-09-23 ENCOUNTER — Other Ambulatory Visit (HOSPITAL_COMMUNITY): Payer: Self-pay

## 2022-09-25 ENCOUNTER — Other Ambulatory Visit: Payer: Self-pay

## 2022-09-28 ENCOUNTER — Other Ambulatory Visit (HOSPITAL_COMMUNITY): Payer: Self-pay

## 2022-10-03 ENCOUNTER — Other Ambulatory Visit (HOSPITAL_COMMUNITY): Payer: Self-pay

## 2022-10-03 DIAGNOSIS — E539 Vitamin B deficiency, unspecified: Secondary | ICD-10-CM | POA: Diagnosis not present

## 2022-10-03 DIAGNOSIS — R7303 Prediabetes: Secondary | ICD-10-CM | POA: Diagnosis not present

## 2022-10-03 DIAGNOSIS — I10 Essential (primary) hypertension: Secondary | ICD-10-CM | POA: Diagnosis not present

## 2022-10-03 DIAGNOSIS — G4733 Obstructive sleep apnea (adult) (pediatric): Secondary | ICD-10-CM | POA: Diagnosis not present

## 2022-10-03 MED ORDER — WEGOVY 2.4 MG/0.75ML ~~LOC~~ SOAJ
2.4000 mg | SUBCUTANEOUS | 1 refills | Status: AC
  Filled 2022-10-03 – 2023-02-01 (×4): qty 3, 28d supply, fill #0
  Filled 2023-03-04 – 2023-03-31 (×3): qty 3, 28d supply, fill #1

## 2022-10-03 MED ORDER — LOMAIRA 8 MG PO TABS
8.0000 mg | ORAL_TABLET | Freq: Every day | ORAL | 0 refills | Status: AC
  Filled 2022-10-03 – 2022-12-07 (×3): qty 30, 30d supply, fill #0

## 2022-10-10 ENCOUNTER — Encounter (HOSPITAL_COMMUNITY): Payer: Self-pay

## 2022-10-10 ENCOUNTER — Other Ambulatory Visit (HOSPITAL_COMMUNITY): Payer: Self-pay

## 2022-10-10 DIAGNOSIS — Z79899 Other long term (current) drug therapy: Secondary | ICD-10-CM | POA: Diagnosis not present

## 2022-10-10 DIAGNOSIS — I10 Essential (primary) hypertension: Secondary | ICD-10-CM | POA: Diagnosis not present

## 2022-10-10 DIAGNOSIS — E559 Vitamin D deficiency, unspecified: Secondary | ICD-10-CM | POA: Diagnosis not present

## 2022-10-10 DIAGNOSIS — R7303 Prediabetes: Secondary | ICD-10-CM | POA: Diagnosis not present

## 2022-10-10 DIAGNOSIS — Z0000 Encounter for general adult medical examination without abnormal findings: Secondary | ICD-10-CM | POA: Diagnosis not present

## 2022-10-10 MED ORDER — TRIAMCINOLONE ACETONIDE 55 MCG/ACT NA AERO
1.0000 | INHALATION_SPRAY | Freq: Every day | NASAL | 5 refills | Status: AC
  Filled 2022-10-10 (×2): qty 16.9, 30d supply, fill #0
  Filled 2022-10-22: qty 16.9, 1d supply, fill #0
  Filled 2022-11-07: qty 16.9, 30d supply, fill #0

## 2022-10-11 ENCOUNTER — Other Ambulatory Visit (HOSPITAL_COMMUNITY): Payer: Self-pay

## 2022-10-21 ENCOUNTER — Other Ambulatory Visit (HOSPITAL_COMMUNITY): Payer: Self-pay

## 2022-10-24 ENCOUNTER — Other Ambulatory Visit (HOSPITAL_COMMUNITY): Payer: Self-pay

## 2022-10-24 ENCOUNTER — Other Ambulatory Visit: Payer: Self-pay

## 2022-10-27 ENCOUNTER — Other Ambulatory Visit (HOSPITAL_COMMUNITY): Payer: Self-pay

## 2022-10-27 MED ORDER — AMLODIPINE BESYLATE 5 MG PO TABS
5.0000 mg | ORAL_TABLET | Freq: Every day | ORAL | 3 refills | Status: AC
  Filled 2022-10-27 – 2022-11-07 (×2): qty 90, 90d supply, fill #0
  Filled 2023-02-22: qty 90, 90d supply, fill #1

## 2022-11-03 ENCOUNTER — Other Ambulatory Visit (HOSPITAL_COMMUNITY): Payer: Self-pay

## 2022-11-08 ENCOUNTER — Other Ambulatory Visit: Payer: Self-pay

## 2022-11-08 ENCOUNTER — Other Ambulatory Visit (HOSPITAL_COMMUNITY): Payer: Self-pay

## 2022-11-08 DIAGNOSIS — R7303 Prediabetes: Secondary | ICD-10-CM | POA: Diagnosis not present

## 2022-11-08 DIAGNOSIS — Z6841 Body Mass Index (BMI) 40.0 and over, adult: Secondary | ICD-10-CM | POA: Diagnosis not present

## 2022-11-08 DIAGNOSIS — G4733 Obstructive sleep apnea (adult) (pediatric): Secondary | ICD-10-CM | POA: Diagnosis not present

## 2022-11-08 DIAGNOSIS — K912 Postsurgical malabsorption, not elsewhere classified: Secondary | ICD-10-CM | POA: Diagnosis not present

## 2022-11-08 DIAGNOSIS — I10 Essential (primary) hypertension: Secondary | ICD-10-CM | POA: Diagnosis not present

## 2022-11-08 MED ORDER — LOMAIRA 8 MG PO TABS
8.0000 mg | ORAL_TABLET | Freq: Every day | ORAL | 0 refills | Status: AC
  Filled 2022-11-08 (×2): qty 30, 30d supply, fill #0

## 2022-11-08 MED ORDER — WEGOVY 2.4 MG/0.75ML ~~LOC~~ SOAJ
2.4000 mg | SUBCUTANEOUS | 1 refills | Status: AC
  Filled 2022-11-08: qty 3, 28d supply, fill #0

## 2022-11-08 MED ORDER — VITAMIN D (ERGOCALCIFEROL) 1.25 MG (50000 UNIT) PO CAPS
50000.0000 [IU] | ORAL_CAPSULE | ORAL | 2 refills | Status: AC
  Filled 2022-11-08 – 2023-01-01 (×2): qty 4, 28d supply, fill #0
  Filled 2023-02-01: qty 4, 28d supply, fill #1
  Filled 2023-02-22: qty 4, 28d supply, fill #2

## 2022-11-09 ENCOUNTER — Other Ambulatory Visit (HOSPITAL_COMMUNITY): Payer: Self-pay

## 2022-11-11 ENCOUNTER — Other Ambulatory Visit (HOSPITAL_COMMUNITY): Payer: Self-pay

## 2022-11-11 MED ORDER — HYDROCHLOROTHIAZIDE 12.5 MG PO TABS
12.5000 mg | ORAL_TABLET | ORAL | 3 refills | Status: AC
  Filled 2022-11-11: qty 90, 90d supply, fill #0
  Filled 2023-02-22: qty 90, 90d supply, fill #1

## 2022-11-11 MED ORDER — VALSARTAN 80 MG PO TABS
80.0000 mg | ORAL_TABLET | Freq: Every day | ORAL | 3 refills | Status: AC
  Filled 2022-11-11: qty 90, 90d supply, fill #0
  Filled 2023-02-22: qty 90, 90d supply, fill #1

## 2022-12-07 ENCOUNTER — Other Ambulatory Visit (HOSPITAL_COMMUNITY): Payer: Self-pay

## 2022-12-08 ENCOUNTER — Other Ambulatory Visit (HOSPITAL_COMMUNITY): Payer: Self-pay

## 2022-12-08 DIAGNOSIS — R7303 Prediabetes: Secondary | ICD-10-CM | POA: Diagnosis not present

## 2022-12-08 DIAGNOSIS — G4733 Obstructive sleep apnea (adult) (pediatric): Secondary | ICD-10-CM | POA: Diagnosis not present

## 2022-12-08 DIAGNOSIS — I10 Essential (primary) hypertension: Secondary | ICD-10-CM | POA: Diagnosis not present

## 2022-12-08 DIAGNOSIS — K912 Postsurgical malabsorption, not elsewhere classified: Secondary | ICD-10-CM | POA: Diagnosis not present

## 2022-12-08 MED ORDER — LOMAIRA 8 MG PO TABS
8.0000 mg | ORAL_TABLET | Freq: Every day | ORAL | 0 refills | Status: DC
  Filled 2022-12-08: qty 30, 30d supply, fill #0

## 2022-12-08 MED ORDER — WEGOVY 2.4 MG/0.75ML ~~LOC~~ SOAJ
0.7500 mL | SUBCUTANEOUS | 1 refills | Status: AC
  Filled 2022-12-08 – 2023-01-01 (×2): qty 3, 28d supply, fill #0
  Filled 2023-02-22: qty 3, 28d supply, fill #1

## 2023-01-02 ENCOUNTER — Other Ambulatory Visit: Payer: Self-pay

## 2023-01-02 ENCOUNTER — Other Ambulatory Visit (HOSPITAL_COMMUNITY): Payer: Self-pay

## 2023-01-02 DIAGNOSIS — I10 Essential (primary) hypertension: Secondary | ICD-10-CM | POA: Diagnosis not present

## 2023-01-02 DIAGNOSIS — R7303 Prediabetes: Secondary | ICD-10-CM | POA: Diagnosis not present

## 2023-01-02 DIAGNOSIS — K912 Postsurgical malabsorption, not elsewhere classified: Secondary | ICD-10-CM | POA: Diagnosis not present

## 2023-01-02 DIAGNOSIS — G4733 Obstructive sleep apnea (adult) (pediatric): Secondary | ICD-10-CM | POA: Diagnosis not present

## 2023-01-02 MED ORDER — LOMAIRA 8 MG PO TABS
16.0000 mg | ORAL_TABLET | Freq: Every day | ORAL | 0 refills | Status: DC
  Filled 2023-01-18 – 2023-02-01 (×2): qty 60, 30d supply, fill #0

## 2023-01-02 MED ORDER — WEGOVY 2.4 MG/0.75ML ~~LOC~~ SOAJ
2.4000 mg | SUBCUTANEOUS | 1 refills | Status: AC
  Filled 2023-01-02 – 2023-03-15 (×3): qty 3, 28d supply, fill #0

## 2023-01-13 ENCOUNTER — Other Ambulatory Visit (HOSPITAL_COMMUNITY): Payer: Self-pay

## 2023-01-18 ENCOUNTER — Other Ambulatory Visit: Payer: Self-pay

## 2023-01-18 ENCOUNTER — Other Ambulatory Visit (HOSPITAL_COMMUNITY): Payer: Self-pay

## 2023-01-23 ENCOUNTER — Ambulatory Visit (INDEPENDENT_AMBULATORY_CARE_PROVIDER_SITE_OTHER): Payer: Federal, State, Local not specified - PPO

## 2023-01-23 ENCOUNTER — Encounter (HOSPITAL_COMMUNITY): Payer: Self-pay

## 2023-01-23 ENCOUNTER — Ambulatory Visit (HOSPITAL_COMMUNITY)
Admission: RE | Admit: 2023-01-23 | Discharge: 2023-01-23 | Disposition: A | Discharge status: Home / Self Care | Payer: Federal, State, Local not specified - PPO | Source: Ambulatory Visit

## 2023-01-23 VITALS — BP 127/96 | HR 89 | Temp 97.9°F | Resp 20 | Ht 64.0 in | Wt 267.0 lb

## 2023-01-23 DIAGNOSIS — S299XXA Unspecified injury of thorax, initial encounter: Secondary | ICD-10-CM

## 2023-01-23 DIAGNOSIS — W19XXXA Unspecified fall, initial encounter: Secondary | ICD-10-CM | POA: Diagnosis not present

## 2023-01-23 DIAGNOSIS — R0789 Other chest pain: Secondary | ICD-10-CM | POA: Diagnosis not present
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
## 2023-01-23 NOTE — Discharge Instructions (Signed)
There were no obvious displaced fractures on your imaging.  Please ice the area and alternate between 800 mg of ibuprofen and 500 mg of Tylenol every 4-6 hours for any aches and pains.  You can splint the area with a pillow for support when coughing.  Your symptoms should improve over the next week or 2, please follow-up with your primary care return to clinic for any new or urgent symptoms.

## 2023-01-31 ENCOUNTER — Other Ambulatory Visit (HOSPITAL_COMMUNITY): Payer: Self-pay

## 2023-02-01 ENCOUNTER — Other Ambulatory Visit (HOSPITAL_COMMUNITY): Payer: Self-pay

## 2023-02-07 ENCOUNTER — Other Ambulatory Visit (HOSPITAL_COMMUNITY): Payer: Self-pay

## 2023-02-07 DIAGNOSIS — G4733 Obstructive sleep apnea (adult) (pediatric): Secondary | ICD-10-CM | POA: Diagnosis not present

## 2023-02-07 DIAGNOSIS — I10 Essential (primary) hypertension: Secondary | ICD-10-CM | POA: Diagnosis not present

## 2023-02-07 DIAGNOSIS — Z6841 Body Mass Index (BMI) 40.0 and over, adult: Secondary | ICD-10-CM | POA: Diagnosis not present

## 2023-02-07 DIAGNOSIS — Z79899 Other long term (current) drug therapy: Secondary | ICD-10-CM | POA: Diagnosis not present

## 2023-02-07 DIAGNOSIS — R7303 Prediabetes: Secondary | ICD-10-CM | POA: Diagnosis not present

## 2023-02-07 MED ORDER — LOMAIRA 8 MG PO TABS
16.0000 mg | ORAL_TABLET | Freq: Every day | ORAL | 0 refills | Status: DC
  Filled 2023-03-04 – 2023-03-17 (×2): qty 60, 30d supply, fill #0

## 2023-02-07 MED ORDER — WEGOVY 2.4 MG/0.75ML ~~LOC~~ SOAJ: 2.4000 mg | SUBCUTANEOUS | 1 refills | Status: AC

## 2023-03-05 ENCOUNTER — Other Ambulatory Visit (HOSPITAL_COMMUNITY): Payer: Self-pay

## 2023-03-06 ENCOUNTER — Other Ambulatory Visit: Payer: Self-pay

## 2023-03-14 ENCOUNTER — Other Ambulatory Visit (HOSPITAL_COMMUNITY): Payer: Self-pay

## 2023-03-15 ENCOUNTER — Other Ambulatory Visit: Payer: Self-pay

## 2023-03-15 ENCOUNTER — Other Ambulatory Visit (HOSPITAL_COMMUNITY): Payer: Self-pay

## 2023-03-16 ENCOUNTER — Other Ambulatory Visit (HOSPITAL_COMMUNITY): Payer: Self-pay

## 2023-03-17 ENCOUNTER — Other Ambulatory Visit (HOSPITAL_COMMUNITY): Payer: Self-pay

## 2023-03-27 ENCOUNTER — Other Ambulatory Visit (HOSPITAL_COMMUNITY): Payer: Self-pay

## 2023-03-28 ENCOUNTER — Other Ambulatory Visit (HOSPITAL_COMMUNITY): Payer: Self-pay

## 2023-03-28 MED ORDER — WEGOVY 2.4 MG/0.75ML ~~LOC~~ SOAJ
2.4000 mg | SUBCUTANEOUS | 1 refills | Status: AC
  Filled 2023-03-28: qty 3, 28d supply, fill #0

## 2023-03-28 MED ORDER — LOMAIRA 8 MG PO TABS
16.0000 mg | ORAL_TABLET | Freq: Every day | ORAL | 0 refills | Status: AC
  Filled 2023-03-28: qty 60, 30d supply, fill #0

## 2023-03-29 ENCOUNTER — Other Ambulatory Visit (HOSPITAL_COMMUNITY): Payer: Self-pay

## 2023-03-29 MED ORDER — VITAMIN D (ERGOCALCIFEROL) 1.25 MG (50000 UNIT) PO CAPS
50000.0000 [IU] | ORAL_CAPSULE | ORAL | 2 refills | Status: AC
  Filled 2023-03-29: qty 4, 28d supply, fill #0

## 2023-03-31 ENCOUNTER — Other Ambulatory Visit (HOSPITAL_COMMUNITY): Payer: Self-pay

## 2024-03-22 ENCOUNTER — Other Ambulatory Visit: Payer: Self-pay | Admitting: Family Medicine

## 2024-03-22 DIAGNOSIS — Z1231 Encounter for screening mammogram for malignant neoplasm of breast: Secondary | ICD-10-CM

## 2024-04-03 ENCOUNTER — Ambulatory Visit
Admission: RE | Admit: 2024-04-03 | Discharge: 2024-04-03 | Disposition: A | Source: Ambulatory Visit | Attending: Family Medicine

## 2024-04-03 DIAGNOSIS — Z1231 Encounter for screening mammogram for malignant neoplasm of breast: Secondary | ICD-10-CM
